# Patient Record
Sex: Female | Born: 2005 | Race: White | Hispanic: No | Marital: Single | Smoking: Never smoker
Health system: Southern US, Community
[De-identification: ages and names within clinical notes are randomized; demographics above are authoritative.]

---
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
## 2023-03-24 ENCOUNTER — Encounter: Payer: Self-pay | Admitting: Dermatology

## 2023-03-24 ENCOUNTER — Ambulatory Visit (INDEPENDENT_AMBULATORY_CARE_PROVIDER_SITE_OTHER): Payer: Medicaid Other | Admitting: Dermatology

## 2023-03-24 DIAGNOSIS — L700 Acne vulgaris: Secondary | ICD-10-CM | POA: Diagnosis not present

## 2023-03-24 MED ORDER — SPIRONOLACTONE 100 MG PO TABS: 100.0000 mg | ORAL_TABLET | Freq: Every day | ORAL | 2 refills | Status: DC

## 2023-03-24 MED ORDER — RETIN-A 0.05 % EX CREA: TOPICAL_CREAM | Freq: Every day | CUTANEOUS | 2 refills | Status: DC

## 2023-03-24 MED ORDER — CLINDAMYCIN PHOSPHATE 1 % EX SWAB: CUTANEOUS | 2 refills | Status: DC

## 2023-03-24 NOTE — Patient Instructions (Addendum)
Daily Regimen Template:  Morning: 1. Wash your face with a gentle cleanser (on weekends recommend Neutrogena Stubborn Texture) 2. Apply a pea-sized amount of  clindamycin to face 3. Follow with a moisturizer and sunscreen suitable for acne-prone skin.  Evening: 1. Wash your face with a gentle cleanser  2. Apply a pea-sized amount of  Retin-A 0.05% gel to entire face.  Start only using 2-3 night per week and gradually increase as tolerated. 3. Apply a non-comedogenic moisturizer to keep the skin hydrated overnight (Neutrogena, CeraVe, Cetaphil)  Note: Always follow your dermatologist's recommendations and treatment plan for best results. Consistency is key to managing acne effectively. If you experience any severe side effects or worsening of symptoms, consult your healthcare provider promptly.  Topical retinoid medications like tretinoin/Retin-A, adapalene/Differin, tazarotene/Fabior, and Epiduo/Epiduo Forte can cause dryness and irritation when first started. Only apply a pea-sized amount to the entire affected area. Avoid applying it around the eyes, edges of mouth and creases at the nose. If you experience irritation, use a good moisturizer first and/or apply the medicine less often. If you are doing well with the medicine, you can increase how often you use it until you are applying every night. Be careful with sun protection while using this medication as it can make you sensitive to the sun. This medicine should not be used by pregnant women.   Spironolactone can cause increased urination and cause blood pressure to decrease. Please watch for signs of lightheadedness and be cautious when changing position. It can sometimes cause breast tenderness or an irregular period in premenopausal women. It can also increase potassium. The increase in potassium usually is not a concern unless you are taking other medicines that also increase potassium, so please be sure your doctor knows all of the other  medications you are taking. This medication should not be taken by pregnant women.  This medicine should also not be taken together with sulfa drugs like Bactrim (trimethoprim/sulfamethexazole).   Due to recent changes in healthcare laws, you may see results of your pathology and/or laboratory studies on MyChart before the doctors have had a chance to review them. We understand that in some cases there may be results that are confusing or concerning to you. Please understand that not all results are received at the same time and often the doctors may need to interpret multiple results in order to provide you with the best plan of care or course of treatment. Therefore, we ask that you please give [place] 2 business days to thoroughly review all your results before contacting the office for clarification. Should we see a critical lab result, you will be contacted sooner.   If You Need Anything After Your Visit  If you have any questions or concerns for your doctor, please call our main line at [phone] If no one answers, please leave a voicemail as directed and we will return your call as soon as possible. Messages left after 4 pm will be answered the following business day.   You may also send [place] a message via MyChart. We typically respond to MyChart messages within 1-2 business days.  For prescription refills, please ask your pharmacy to contact our office. Our fax number is [phone].  If you have an urgent issue when the clinic is closed that cannot wait until the next business day, you can page your doctor at the number below.    Please note that while we do our best to be available for urgent issues outside  of office hours, we are not available 24/7.   If you have an urgent issue and are unable to reach [place], you may choose to seek medical care at your doctor's office, retail clinic, urgent care center, or emergency room.  If you have a medical emergency, please immediately call 911 or go to  the emergency department. In the event of inclement weather, please call our main line at [phone] for an update on the status of any delays or closures.  Dermatology Medication Tips: Please keep the boxes that topical medications come in in order to help keep track of the instructions about where and how to use these. Pharmacies typically print the medication instructions only on the boxes and not directly on the medication tubes.   If your medication is too expensive, please contact our office at [phone] or send [place] a message through MyChart.   We are unable to tell what your co-pay for medications will be in advance as this is different depending on your insurance coverage. However, we may be able to find a substitute medication at lower cost or fill out paperwork to get insurance to cover a needed medication.   If a prior authorization is required to get your medication covered by your insurance company, please allow [place] 1-2 business days to complete this process.  Drug prices often vary depending on where the prescription is filled and some pharmacies may offer cheaper prices.  The website www.goodrx.com contains coupons for medications through different pharmacies. The prices here do not account for what the cost may be with help from insurance (it may be cheaper with your insurance), but the website can give you the price if you did not use any insurance.  - You can print the associated coupon and take it with your prescription to the pharmacy.  - You may also stop by our office during regular business hours and pick up a GoodRx coupon card.  - If you need your prescription sent electronically to a different pharmacy, notify our office through [facility] or by phone at [phone]

## 2023-03-24 NOTE — Progress Notes (Signed)
   New Patient Visit   Subjective  Latoya Hayes is a 17 y.o. female who presents for the following: acne at face. Comes and goes, she does get cystic bumps. She has not done any prescription acne treatments but is using Kiehls moisturizer, Elta MD sunscreen and a goat's milk face wash. She sleeps with a bonnet on and changes her pillowcase daily. Patient has used OTC Differin which broke her out worse. Acne is worse with periods. She also has a rash at face that came up about 1 month ago and thinks it could be related to the acne.   Patient accompanied by grandmother who contributes to history.  The following portions of the chart were reviewed this encounter and updated as appropriate: medications, allergies, medical history  Review of Systems:  No other skin or systemic complaints except as noted in HPI or Assessment and Plan.  Objective  Well appearing patient in no apparent distress; mood and affect are within normal limits.   A focused examination was performed of the following areas: face  Relevant exam findings are noted in the Assessment and Plan.    Assessment & Plan   ACNE VULGARIS Exam: Open comedones and inflammatory papules on forehead, cheeks and nose  Chronic and persistent condition with duration or expected duration over one year. Condition is bothersome/symptomatic for patient. Currently flared.   Treatment Plan: Start clindamycin swabs daily in the morning after washing face.  Start Retin-A 0.05% gel (if not covered can switch to cream) nightly using a pea size amount to entire face on Monday, Wednesday and Fridays.  Start spironolactone   Recommend Neutrogena Stubborn Texture face wash on weekends only.   Topical retinoid medications like tretinoin/Retin-A, adapalene/Differin, tazarotene/Fabior, and Epiduo/Epiduo Forte can cause dryness and irritation when first started. Only apply a pea-sized amount to the entire affected area. Avoid applying it around  the eyes, edges of mouth and creases at the nose. If you experience irritation, use a good moisturizer first and/or apply the medicine less often. If you are doing well with the medicine, you can increase how often you use it until you are applying every night. Be careful with sun protection while using this medication as it can make you sensitive to the sun. This medicine should not be used by pregnant women.   Spironolactone can cause increased urination and cause blood pressure to decrease. Please watch for signs of lightheadedness and be cautious when changing position. It can sometimes cause breast tenderness or an irregular period in premenopausal women. It can also increase potassium. The increase in potassium usually is not a concern unless you are taking other medicines that also increase potassium, so please be sure your doctor knows all of the other medications you are taking. This medication should not be taken by pregnant women.  This medicine should also not be taken together with sulfa drugs like Bactrim (trimethoprim/sulfamethexazole).     Return in about 3 months (around 06/24/2023) for acne.  Anise Salvo, RMA, am acting as scribe for [employer], DO .   Documentation: I have reviewed the above documentation for accuracy and completeness, and I agree with the above.  Langston Reusing, DO

## 2023-05-25 ENCOUNTER — Ambulatory Visit (INDEPENDENT_AMBULATORY_CARE_PROVIDER_SITE_OTHER): Payer: Medicaid Other | Admitting: Advanced Practice Midwife

## 2023-05-25 ENCOUNTER — Other Ambulatory Visit (HOSPITAL_COMMUNITY)
Admission: RE | Admit: 2023-05-25 | Discharge: 2023-05-25 | Disposition: A | Discharge status: Home / Self Care | Payer: Medicaid Other | Source: Ambulatory Visit | Attending: Advanced Practice Midwife | Admitting: Advanced Practice Midwife

## 2023-05-25 VITALS — BP 119/83 | HR 88 | Ht 64.0 in | Wt 130.5 lb

## 2023-05-25 DIAGNOSIS — L739 Follicular disorder, unspecified: Secondary | ICD-10-CM | POA: Diagnosis not present

## 2023-05-25 DIAGNOSIS — Z113 Encounter for screening for infections with a predominantly sexual mode of transmission: Secondary | ICD-10-CM

## 2023-05-25 NOTE — Progress Notes (Signed)
Pt. Presents with blisters/bumps on vagina about a week ago. No itching, or burning. Lasted for 3 days but are gone now.  Pt. Did stat that she shaved and two days after is when she noticed the blisters/bumps

## 2023-05-25 NOTE — Progress Notes (Signed)
   GYNECOLOGY PROGRESS NOTE  History:  17 y.o. No obstetric history on file. presents to [facility] office today for problem gyn visit. She reports bumps on her labia and mons area that started 1 week ago and lasted 3 days .  She denies h/a, dizziness, shortness of breath, n/v, or fever/chills.    The following portions of the patient's history were reviewed and updated as appropriate: allergies, current medications, past family history, past medical history, past social history, past surgical history and problem list.   Health Maintenance Due  Topic Date Due   DTaP/Tdap/Td (1 - Tdap) Never done   CHLAMYDIA SCREENING  Never done   HPV VACCINES (1 - 3-dose series) Never done   HIV Screening  Never done   COVID-19 Vaccine (1 - 2023-24 season) Never done   Hepatitis C Screening  Never done   INFLUENZA VACCINE  05/07/2023     Review of Systems:  Pertinent items are noted in HPI.   Objective:  Physical Exam Blood pressure 119/83, pulse 88, height 5\' 4"  (1.626 m), weight 130 lb 8 oz (59.2 kg), last menstrual period 05/20/2023.  VS reviewed, nursing note reviewed,  Constitutional: well developed, well nourished, no distress HEENT: normocephalic CV: normal rate Pulm/chest wall: normal effort Breast Exam: deferred Abdomen: soft Neuro: alert and oriented x 3 Skin: warm, dry Psych: affect normal Pelvic exam: on visual inspection, no visible lesions, no abnormalities  Assessment & Plan:  1. Routine screening for STI (sexually transmitted infection)  - Cervicovaginal ancillary only( [place]) - HIV antibody (with reflex) - RPR - Hepatitis B Surface AntiGEN - Hepatitis C Antibody  2. Folliculitis --Discussed presentation and appearance of lesions with pt.   --Likely folliculitis --If bumps reoccur, pt to come to office for testing    No follow-ups on file.   Sharen Counter, CNM 3:51 PM

## 2023-05-26 LAB — HEPATITIS B SURFACE ANTIGEN: Hepatitis B Surface Ag: NEGATIVE

## 2023-05-26 LAB — RPR: RPR Ser Ql: NONREACTIVE

## 2023-05-26 LAB — HEPATITIS C ANTIBODY: Hep C Virus Ab: NONREACTIVE

## 2023-05-26 LAB — HIV ANTIBODY (ROUTINE TESTING W REFLEX): HIV Screen 4th Generation wRfx: NONREACTIVE

## 2023-05-27 LAB — CERVICOVAGINAL ANCILLARY ONLY
Chlamydia: NEGATIVE
Comment: NEGATIVE
Comment: NEGATIVE
Comment: NORMAL
Neisseria Gonorrhea: NEGATIVE
Trichomonas: NEGATIVE

## 2023-06-19 ENCOUNTER — Other Ambulatory Visit: Payer: Self-pay | Admitting: Dermatology

## 2023-06-24 ENCOUNTER — Ambulatory Visit: Payer: Medicaid Other | Admitting: Dermatology

## 2023-06-24 ENCOUNTER — Encounter: Payer: Self-pay | Admitting: Dermatology

## 2023-06-24 DIAGNOSIS — L700 Acne vulgaris: Secondary | ICD-10-CM

## 2023-06-24 MED ORDER — SPIRONOLACTONE 50 MG PO TABS: 150.0000 mg | ORAL_TABLET | Freq: Every evening | ORAL | 3 refills | Status: DC

## 2023-06-24 MED ORDER — RETIN-A 0.05 % EX CREA: TOPICAL_CREAM | Freq: Every day | CUTANEOUS | 4 refills | Status: DC

## 2023-06-24 NOTE — Patient Instructions (Signed)

## 2023-06-24 NOTE — Progress Notes (Signed)
   Follow-Up Visit   Subjective  Latoya Hayes is a 17 y.o. female who presents for the following: Acne  Patient present today for follow up visit for Acne. Patient was last evaluated on 03/24/23. Patient reports sxs are worse. Patient denies medication changes.  The following portions of the chart were reviewed this encounter and updated as appropriate: medications, allergies, medical history  Review of Systems:  No other skin or systemic complaints except as noted in HPI or Assessment and Plan.  Objective  Well appearing patient in no apparent distress; mood and affect are within normal limits.  A focused examination was performed of the following areas: Face  Relevant exam findings are noted in the Assessment and Plan.         Assessment & Plan   ACNE VULGARIS Exam: Open comedones and inflammatory papules  Flared  Treatment Plan: - We will plan to increase spironolactone to 150 mg nightly - We will increase Tretinoin 0.05% to 5 nights weekly - We will plan to follow up in 3 months   No follow-ups on file.   Documentation: I have reviewed the above documentation for accuracy and completeness, and I agree with the above.  Stasia Cavalier, am acting as scribe for Langston Reusing, DO.   Langston Reusing, DO

## 2023-06-28 ENCOUNTER — Other Ambulatory Visit: Payer: Self-pay | Admitting: Obstetrics and Gynecology

## 2023-07-16 ENCOUNTER — Other Ambulatory Visit: Payer: Self-pay

## 2023-07-16 MED ORDER — NORETHIN ACE-ETH ESTRAD-FE 1-20 MG-MCG(24) PO TABS: 1.0000 | ORAL_TABLET | Freq: Every day | ORAL | 2 refills | Status: DC

## 2023-08-31 ENCOUNTER — Other Ambulatory Visit: Payer: Self-pay | Admitting: Dermatology

## 2023-09-23 ENCOUNTER — Other Ambulatory Visit: Payer: Self-pay | Admitting: Dermatology

## 2023-09-23 DIAGNOSIS — L700 Acne vulgaris: Secondary | ICD-10-CM

## 2023-10-07 ENCOUNTER — Other Ambulatory Visit: Payer: Self-pay | Admitting: Dermatology

## 2023-10-07 DIAGNOSIS — L700 Acne vulgaris: Secondary | ICD-10-CM

## 2023-10-08 ENCOUNTER — Other Ambulatory Visit: Payer: Self-pay

## 2023-10-13 ENCOUNTER — Other Ambulatory Visit: Payer: Self-pay | Admitting: Dermatology

## 2023-10-16 ENCOUNTER — Other Ambulatory Visit: Payer: Self-pay | Admitting: *Deleted

## 2023-10-16 MED ORDER — NORETHIN ACE-ETH ESTRAD-FE 1-20 MG-MCG(24) PO TABS: 1.0000 | ORAL_TABLET | Freq: Every day | ORAL | 6 refills | Status: DC

## 2023-10-16 NOTE — Progress Notes (Signed)
 Refill on [facility] sent today.

## 2023-10-26 ENCOUNTER — Ambulatory Visit (INDEPENDENT_AMBULATORY_CARE_PROVIDER_SITE_OTHER): Payer: Medicaid Other | Admitting: Dermatology

## 2023-10-26 VITALS — BP 108/68 | HR 75

## 2023-10-26 DIAGNOSIS — L209 Atopic dermatitis, unspecified: Secondary | ICD-10-CM | POA: Diagnosis not present

## 2023-10-26 DIAGNOSIS — L700 Acne vulgaris: Secondary | ICD-10-CM | POA: Diagnosis not present

## 2023-10-26 MED ORDER — CLOBETASOL PROPIONATE 0.05 % EX CREA: 1.0000 | TOPICAL_CREAM | Freq: Two times a day (BID) | CUTANEOUS | 4 refills | Status: DC

## 2023-10-26 NOTE — Progress Notes (Signed)
   Follow-Up Visit   Subjective  Latoya Hayes is a 18 y.o. female who presents for the following: Acne  Patient present today for follow up visit for Acne. Patient was last evaluated on 06/24/23.Patient reports she has stopped the spironolactone because it was causing her to vomit. She is still using Trertinonin 0.5% 5 nights weekly. Patient reports sxs are  improving . Patient denies medication changes.  Patient also has atopic Dermatis on B/L LE. She is currently not any treatment for her legs.   The following portions of the chart were reviewed this encounter and updated as appropriate: medications, allergies, medical history  Review of Systems:  No other skin or systemic complaints except as noted in HPI or Assessment and Plan.  Objective  Well appearing patient in no apparent distress; mood and affect are within normal limits.  A focused examination was performed of the following areas: Face  Relevant exam findings are noted in the Assessment and Plan.          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 It is often associated with allergic rhinitis and/or asthma and can require treatment with topical medications, phototherapy, or in severe cases biologic injectable medication (Dupixent; Adbry) or Oral JAK inhibitors.  Treatment Plan: - We will prescribe Clobetasol Cream TO APPLY TO THE EFFECTED AREAS 2 TIMES A DAY FOR 2 DAYS THEN STOP - Recommend gentle skin care  ACNE VULGARIS Exam: Open comedones and inflammatory papules  Well Controlled  Treatment Plan: - Recommended continuing Tretinoin 0.5% M-F - Recommended continuing Clindamycin Swabs - Continue washing with Neutrogena Stubborn texture - We will plan to follow up in 6 months     No follow-ups on  file.    Documentation: I have reviewed the above documentation for accuracy and completeness, and I agree with the above.  Stasia Cavalier, am acting as scribe for Langston Reusing, DO.  Langston Reusing, DO

## 2023-10-26 NOTE — Patient Instructions (Addendum)
Hello Sidrah,  Thank you for visiting [place] today. I appreciate your dedication to improving your skin health. Below is a summary of our discussion and the instructions for your treatment plan:  Medications for Eczema:   Clobetasol Cream: Apply morning and night, twice a day for up to 2 weeks. Discontinue after this period or if the condition improves sooner.  Skincare Routine for Face:   Morning Routine: Continue using Neutrogena Stubborn Texture Face Wash followed by Clindamycin Pads and Kiehl's Moisturizer.   Evening Routine: Use Oat Milk Face Wash, Tretinoin 0.05% (5 nights a week), and [employer].   Weekend Routine: Limit to just washing and moisturizing.  Body Skincare:   Moisturizing: Use Eucerin Advanced Repair and Aquaphor Spray to lock in moisture.   Showering: Use warm water and a super moisturizer to prevent moisture loss.  Follow-Up:   Next Appointment: Schedule a follow-up appointment in July unless issues with eczema persist.  Your current facial skincare regimen is effective, and no changes are needed. Please follow the new instructions for managing your eczema and continue with your successful facial care routine. If you have any questions or concerns before our next scheduled visit, feel free to contact the office.  Best regards,  Dr. Langston Reusing Dermatology   Important Information   Due to recent changes in healthcare laws, you may see results of your pathology and/or laboratory studies on MyChart before the doctors have had a chance to review them. We understand that in some cases there may be results that are confusing or concerning to you. Please understand that not all results are received at the same time and often the doctors may need to interpret multiple results in order to provide you with the best plan of care or course of treatment. Therefore, we ask that you please give [place] 2 business days to thoroughly review all your results before contacting  the office for clarification. Should we see a critical lab result, you will be contacted sooner.     If You Need Anything After Your Visit   If you have any questions or concerns for your doctor, please call our main line at [phone]. If no one answers, please leave a voicemail as directed and we will return your call as soon as possible. Messages left after 4 pm will be answered the following business day.    You may also send [place] a message via MyChart. We typically respond to MyChart messages within 1-2 business days.  For prescription refills, please ask your pharmacy to contact our office. Our fax number is [phone].  If you have an urgent issue when the clinic is closed that cannot wait until the next business day, you can page your doctor at the number below.     Please note that while we do our best to be available for urgent issues outside of office hours, we are not available 24/7.    If you have an urgent issue and are unable to reach [place], you may choose to seek medical care at your doctor's office, retail clinic, urgent care center, or emergency room.   If you have a medical emergency, please immediately call 911 or go to the emergency department. In the event of inclement weather, please call our main line at [phone] for an update on the status of any delays or closures.  Dermatology Medication Tips: Please keep the boxes that topical medications come in in order to help keep track of the instructions about where and  how to use these. Pharmacies typically print the medication instructions only on the boxes and not directly on the medication tubes.   If your medication is too expensive, please contact our office at [phone] or send [place] a message through MyChart.    We are unable to tell what your co-pay for medications will be in advance as this is different depending on your insurance coverage. However, we may be able to find a substitute medication at lower cost or  fill out paperwork to get insurance to cover a needed medication.    If a prior authorization is required to get your medication covered by your insurance company, please allow [place] 1-2 business days to complete this process.   Drug prices often vary depending on where the prescription is filled and some pharmacies may offer cheaper prices.   The website www.goodrx.com contains coupons for medications through different pharmacies. The prices here do not account for what the cost may be with help from insurance (it may be cheaper with your insurance), but the website can give you the price if you did not use any insurance.  - You can print the associated coupon and take it with your prescription to the pharmacy.  - You may also stop by our office during regular business hours and pick up a GoodRx coupon card.  - If you need your prescription sent electronically to a different pharmacy, notify our office through [facility] or by phone at [phone]

## 2023-11-01 ENCOUNTER — Encounter: Payer: Self-pay | Admitting: Dermatology

## 2024-02-02 ENCOUNTER — Other Ambulatory Visit: Payer: Self-pay | Admitting: Dermatology

## 2024-03-27 ENCOUNTER — Other Ambulatory Visit: Payer: Self-pay | Admitting: Obstetrics and Gynecology

## 2024-03-28 ENCOUNTER — Other Ambulatory Visit: Payer: Self-pay

## 2024-03-28 MED ORDER — NORETHIN ACE-ETH ESTRAD-FE 1-20 MG-MCG(24) PO TABS: 1.0000 | ORAL_TABLET | Freq: Every day | ORAL | 2 refills | Status: DC

## 2024-04-26 ENCOUNTER — Ambulatory Visit (INDEPENDENT_AMBULATORY_CARE_PROVIDER_SITE_OTHER): Payer: Medicaid Other | Admitting: Dermatology

## 2024-04-26 ENCOUNTER — Encounter: Payer: Self-pay | Admitting: Dermatology

## 2024-04-26 VITALS — BP 65/62

## 2024-04-26 DIAGNOSIS — L700 Acne vulgaris: Secondary | ICD-10-CM | POA: Diagnosis not present

## 2024-04-26 DIAGNOSIS — L299 Pruritus, unspecified: Secondary | ICD-10-CM | POA: Diagnosis not present

## 2024-04-26 DIAGNOSIS — L209 Atopic dermatitis, unspecified: Secondary | ICD-10-CM

## 2024-04-26 MED ORDER — CLOBETASOL PROPIONATE 0.05 % EX CREA: 1.0000 | TOPICAL_CREAM | Freq: Two times a day (BID) | CUTANEOUS | 4 refills | Status: DC

## 2024-04-26 MED ORDER — CLINDAMYCIN PHOSPHATE 1 % EX SWAB: 1.0000 "application " | Freq: Every morning | CUTANEOUS | 3 refills | Status: DC

## 2024-04-26 NOTE — Patient Instructions (Addendum)
 Date: Tue Apr 26 2024  Hello [place],  Thank you for visiting today. Here is a summary of the key instructions:  - Medications:   - Continue using clindamycin  pads every morning   - Use tretinoin  0.05% three nights a week   - Reduce tretinoin  to two nights a week during winter months if needed  - Skin Care:   - Rinse off chlorine immediately after swimming   - Use Eucerin moisturizer daily   - Try Coppertone Pure and Simple zinc oxide spray sunscreen   - Avoid Blue Lizard sunscreen if it causes itching  - Follow-up:   - Schedule a follow-up appointment in one year (June or July 2026)   - Use MyChart to send messages or schedule earlier appointments if needed  - Prescriptions:   - Three-month supply of clindamycin  pads with three refills   - Call if you run out of tretinoin  before your next appointment  Please reach out if you have any questions or concerns.  Warm regards,  Dr. Delon Lenis, Dermatology       Important Information  Due to recent changes in healthcare laws, you may see results of your pathology and/or laboratory studies on MyChart before the doctors have had a chance to review them. We understand that in some cases there may be results that are confusing or concerning to you. Please understand that not all results are received at the same time and often the doctors may need to interpret multiple results in order to provide you with the best plan of care or course of treatment. Therefore, we ask that you please give us  2 business days to thoroughly review all your results before contacting the office for clarification. Should we see a critical lab result, you will be contacted sooner.   If You Need Anything After Your Visit  If you have any questions or concerns for your doctor, please call our main line at [phone] If no one answers, please leave a voicemail as directed and we will return your call as soon as possible. Messages left after 4 pm will be  answered the following business day.   You may also send us  a message via MyChart. We typically respond to MyChart messages within 1-2 business days.  For prescription refills, please ask your pharmacy to contact our office. Our fax number is [phone].  If you have an urgent issue when the clinic is closed that cannot wait until the next business day, you can page your doctor at the number below.    Please note that while we do our best to be available for urgent issues outside of office hours, we are not available 24/7.   If you have an urgent issue and are unable to reach us , you may choose to seek medical care at your doctor's office, retail clinic, urgent care center, or emergency room.  If you have a medical emergency, please immediately call 911 or go to the emergency department. In the event of inclement weather, please call our main line at [phone] for an update on the status of any delays or closures.  Dermatology Medication Tips: Please keep the boxes that topical medications come in in order to help keep track of the instructions about where and how to use these. Pharmacies typically print the medication instructions only on the boxes and not directly on the medication tubes.   If your medication is too expensive, please contact our office at [phone] or send us  a message through  MyChart.   We are unable to tell what your co-pay for medications will be in advance as this is different depending on your insurance coverage. However, we may be able to find a substitute medication at lower cost or fill out paperwork to get insurance to cover a needed medication.   If a prior authorization is required to get your medication covered by your insurance company, please allow us  1-2 business days to complete this process.  Drug prices often vary depending on where the prescription is filled and some pharmacies may offer cheaper prices.  The website www.goodrx.com contains  coupons for medications through different pharmacies. The prices here do not account for what the cost may be with help from insurance (it may be cheaper with your insurance), but the website can give you the price if you did not use any insurance.  - You can print the associated coupon and take it with your prescription to the pharmacy.  - You may also stop by our office during regular business hours and pick up a GoodRx coupon card.  - If you need your prescription sent electronically to a different pharmacy, notify our office through [facility] or by phone at [phone]

## 2024-04-26 NOTE — Progress Notes (Signed)
   Follow-Up Visit   Subjective  Latoya Hayes is a 18 y.o. female who presents for the following: Acne Vulgaris follow up. She is using clindamycin  swabs every morning and tretinoin  0.05% cream 3 nights per week. She has flares around her periods but is good the rest of the month. She is also following up for atopic dermatitis of her legs. She is using clobetasol  cream about 3 times per week. It seems to be worse when she is in the pool more or she thinks it may be related to the sunscreen she is using.     The following portions of the chart were reviewed this encounter and updated as appropriate: medications, allergies, medical history  Review of Systems:  No other skin or systemic complaints except as noted in HPI or Assessment and Plan.  Objective  Well appearing patient in no apparent distress; mood and affect are within normal limits.  Areas Examined: Face, legs  Relevant exam findings are noted in the Assessment and Plan.           Assessment & Plan   1. Acne - Assessment: Patient's acne has shown significant improvement with current treatment regimen. Combination of clindamycin  pads and tretinoin  working effectively. Patient tolerates tretinoin  0.05% three nights per week, which appears optimal. No oral medications currently needed for acne management.  - Plan:    Continue clindamycin  pads every morning    Continue tretinoin  0.05% three nights per week    Prescribe 6-month supply of clindamycin  pads with 3 refills    Advised to reduce tretinoin  use to two nights per week during winter months if needed    Follow up in one year    Contact office if tretinoin  refill needed before next appointment  2. Pruritus - Assessment: Patient reports persistent itching on legs, particularly after pool exposure. Itching occurs without visible rashes, suggesting possible chlorine sensitivity or inadequate post-swim skin care. Current sunscreen [facility]) may be contributing to  skin irritation. Patient's brother also experiences similar reactions to the sunscreen.  - Plan:    Recommend switching to a physical, mineral sunscreen (e.g., Coppertone Pure and Simple with zinc oxide)    Advise immediate rinsing after swimming to remove chlorine    Continue daily use of Excedrin as a moisturizer    Reassess at next follow-up appointment in approximately one year (June or July) ATOPIC DERMATITIS, UNSPECIFIED TYPE   Related Medications clobetasol  cream (TEMOVATE ) 0.05 % Apply 1 Application topically 2 (two) times daily. APPLY 2 TIMES DAILY FOR 2 WEEKS THEN STOP ACNE VULGARIS Exam: Clear today   Treatment Plan: Continue clindamycin  swabs every morning, tretinoin  0.05% cream at bedtime 3 nights per week  ATOPIC DERMATITIS Exam: Legs are clear today   Treatment Plan: Recommend Claritin or Allegra daily   Continue clobetasol  cream twice daily x 2 weeks as needed.   Recommend Aquaphor spray before getting in the pool.  Recommend gentle skin care. derma   Return in about 1 year (around 04/26/2025) for Acne.  I, Roseline Hutchinson, CMA, am acting as scribe for [employer], DO .   Documentation: I have reviewed the above documentation for accuracy and completeness, and I agree with the above.  Delon Lenis, DO

## 2024-06-17 ENCOUNTER — Other Ambulatory Visit: Payer: Self-pay

## 2024-06-17 MED ORDER — NORETHIN ACE-ETH ESTRAD-FE 1-20 MG-MCG(24) PO TABS: 1.0000 | ORAL_TABLET | Freq: Every day | ORAL | 0 refills | Status: DC

## 2024-08-15 ENCOUNTER — Other Ambulatory Visit: Payer: Self-pay | Admitting: Obstetrics and Gynecology

## 2024-09-07 ENCOUNTER — Other Ambulatory Visit: Payer: Self-pay

## 2024-09-07 ENCOUNTER — Telehealth: Payer: Self-pay | Admitting: Dermatology

## 2024-09-07 MED ORDER — NORETHIN ACE-ETH ESTRAD-FE 1-20 MG-MCG(24) PO TABS: 1.0000 | ORAL_TABLET | Freq: Every day | ORAL | 0 refills | Status: AC

## 2024-09-07 NOTE — Telephone Encounter (Signed)
 Pt states she needs a refill on her birthcontrol medication that she states Dr. Alm prescribed to her. Pt was unable to provide exact medication name. She states she was placed on it for her acne. She states she has called several times to have it refilled and her pharmacy has called, but she has not heard back, she would like a call back to discuss further. Call back number: [phone].

## 2024-09-07 NOTE — Progress Notes (Signed)
 BC refill sent per protocol Next appt 09/19/24

## 2024-09-08 NOTE — Telephone Encounter (Signed)
 I never rx'd pt her oral BC but we did rx her spironolactone .  Plesae call and if that's what she needs then we can refill that.  Per her chart it looks like another doc refilled her oral BC yesterday so it's probable the spironolactone .  Thanks.  Dr JONETTA

## 2024-09-19 ENCOUNTER — Encounter: Payer: Self-pay | Admitting: Obstetrics

## 2024-09-19 ENCOUNTER — Ambulatory Visit: Admitting: Obstetrics

## 2024-09-19 VITALS — BP 125/86 | HR 83 | Ht 64.0 in | Wt 135.2 lb

## 2024-09-19 DIAGNOSIS — N946 Dysmenorrhea, unspecified: Secondary | ICD-10-CM

## 2024-09-19 DIAGNOSIS — Z01419 Encounter for gynecological examination (general) (routine) without abnormal findings: Secondary | ICD-10-CM | POA: Diagnosis not present

## 2024-09-19 DIAGNOSIS — Z3041 Encounter for surveillance of contraceptive pills: Secondary | ICD-10-CM

## 2024-09-19 MED ORDER — IBUPROFEN 800 MG PO TABS: 800.0000 mg | ORAL_TABLET | Freq: Three times a day (TID) | ORAL | 5 refills | Status: DC | PRN

## 2024-09-19 MED ORDER — NORETHIN ACE-ETH ESTRAD-FE 1-20 MG-MCG(24) PO TABS: 1.0000 | ORAL_TABLET | Freq: Every day | ORAL | 11 refills | Status: DC

## 2024-09-19 NOTE — Progress Notes (Addendum)
 Subjective:        Latoya Hayes is a 18 y.o. female here for a routine exam.  Current complaints: None.    Personal health questionnaire:  Is patient Latoya Hayes, have a family history of breast and/or ovarian cancer: no Is there a family history of uterine cancer diagnosed at age < 80, gastrointestinal cancer, urinary tract cancer, family member who is a Personnel Officer syndrome-associated carrier: no Is the patient overweight and hypertensive, family history of diabetes, personal history of gestational diabetes, preeclampsia or PCOS: no Is patient over 51, have PCOS,  family history of premature CHD under age 48, diabetes, smoke, have hypertension or peripheral artery disease:  no At any time, has a partner hit, kicked or otherwise hurt or frightened you?: no Over the past 2 weeks, have you felt down, depressed or hopeless?: no Over the past 2 weeks, have you felt little interest or pleasure in doing things?:no   Gynecologic History Patient's last menstrual period was 08/26/2024 (exact date). Contraception: OCP (estrogen/progesterone) Last Pap: n/a. Results were: n/a Last mammogram: n/a. Results were: n/a  Obstetric History OB History  Gravida Para Term Preterm AB Living  0 0 0 0 0 0  SAB IAB Ectopic Multiple Live Births  0 0 0 0 0    History reviewed. No pertinent past medical history.  History reviewed. No pertinent surgical history.  Current Medications[1] Allergies[2]  Social History   Tobacco Use   Smoking status: Never   Smokeless tobacco: Never  Substance Use Topics   Alcohol use: Never    Family History  Problem Relation Age of Onset   Healthy Father    Healthy Mother       Review of Systems  Constitutional: negative for fatigue and weight loss Respiratory: negative for cough and wheezing Cardiovascular: negative for chest pain, fatigue and palpitations Gastrointestinal: negative for abdominal pain and change in bowel habits Musculoskeletal:negative  for myalgias Neurological: negative for gait problems and tremors Behavioral/Psych: negative for abusive relationship, depression Endocrine: negative for temperature intolerance    Genitourinary:negative for abnormal menstrual periods, genital lesions, hot flashes, sexual problems and vaginal discharge Integument/breast: negative for breast lump, breast tenderness, nipple discharge and skin lesion(s)    Objective:       BP 125/86   Pulse 83   Ht 5' 4 (1.626 m)   Wt 135 lb 3.2 oz (61.3 kg)   LMP 08/26/2024 (Exact Date)   BMI 23.21 kg/m  General:   Alert and no distress  Skin:   no rash or abnormalities  Lungs:   clear to auscultation bilaterally  Heart:   regular rate and rhythm, S1, S2 normal, no murmur, click, rub or gallop  Breasts:   normal without suspicious masses, skin or nipple changes or axillary nodes  Abdomen:  normal findings: no organomegaly, soft, non-tender and no hernia  Pelvis:  External genitalia: normal general appearance Urinary system: urethral meatus normal and bladder without fullness, nontender Vaginal: normal without tenderness, induration or masses Cervix: normal appearance Adnexa: normal bimanual exam Uterus: anteverted and non-tender, normal size   Lab Review Urine pregnancy test Labs reviewed yes Radiologic studies reviewed no  I have spent a total of 20 minutes of face-to-face time, excluding clinical staff time, reviewing notes and preparing to see patient, ordering tests and/or medications, and counseling the patient.   Assessment:    1. Encounter for annual routine gynecological examination (Primary)  2. Encounter for surveillance of contraceptive pills Rx: - Norethindrone Acetate-Ethinyl Estrad-FE (LOESTRIN  24 FE) 1-20 MG-MCG(24) tablet; Take 1 tablet by mouth daily.  Dispense: 28 tablet; Refill: 11  3. Dysmenorrhea Rx: - ibuprofen  (ADVIL ) 800 MG tablet; Take 1 tablet (800 mg total) by mouth every 8 (eight) hours as needed.  Dispense:  30 tablet; Refill: 5     Plan:    Education reviewed: calcium supplements, depression evaluation, low fat, low cholesterol diet, safe sex/STD prevention, self breast exams, skin cancer screening, and weight bearing exercise. Contraception: OCP (estrogen/progesterone). Follow up in: 1 year.   Meds ordered this encounter  Medications   Norethindrone Acetate-Ethinyl Estrad-FE (LOESTRIN 24 FE) 1-20 MG-MCG(24) tablet    Sig: Take 1 tablet by mouth daily.    Dispense:  28 tablet    Refill:  11     CARLIN RONAL CENTERS, MD, FACOG Attending Obstetrician & Gynecologist, [facility] for [facility], [facility], [facility] 09/19/2024     [1]  Current Outpatient Medications:    clindamycin  (CLEOCIN  T) 1 % SWAB, Apply 1 application  topically every morning. Apply to face daily every morning, Disp: 90 each, Rfl: 3   Norethindrone Acetate-Ethinyl Estrad-FE (LOESTRIN 24 FE) 1-20 MG-MCG(24) tablet, Take 1 tablet by mouth daily., Disp: 28 tablet, Rfl: 0   Norethindrone Acetate-Ethinyl Estrad-FE (LOESTRIN 24 FE) 1-20 MG-MCG(24) tablet, Take 1 tablet by mouth daily., Disp: 28 tablet, Rfl: 11   RETIN-A  0.05 % cream, Apply topically at bedtime., Disp: 45 g, Rfl: 4   cetirizine (ZYRTEC) 10 MG tablet, Take 10 mg by mouth daily. (Patient not taking: Reported on 09/19/2024), Disp: , Rfl:    clindamycin  (CLEOCIN  T) 1 % SWAB, APPLY DAILY IN THE MORNING. (Patient not taking: Reported on 09/19/2024), Disp: 60 each, Rfl: 2   clobetasol  cream (TEMOVATE ) 0.05 %, Apply 1 Application topically 2 (two) times daily. APPLY 2 TIMES DAILY FOR 2 WEEKS THEN STOP (Patient not taking: Reported on 09/19/2024), Disp: 60 g, Rfl: 4   fluorouracil  (EFUDEX ) 5 % cream, Apply topically 2 (two) times daily. (Patient not taking: Reported on 09/19/2024), Disp: 40 g, Rfl: 0   norethindrone-ethinyl estradiol (CYCLAFEM) 0.5/0.75/1-35 MG-MCG tablet, Take 1 tablet by mouth daily. (Patient not taking: Reported on  09/19/2024), Disp: , Rfl:    spironolactone  (ALDACTONE ) 50 MG tablet, TAKE 3 TABLETS BY MOUTH AT BEDTIME (Patient not taking: Reported on 09/19/2024), Disp: 270 tablet, Rfl: 3 [2]  Allergies Allergen Reactions   Peanut-Containing Drug Products Rash    Cashew

## 2024-09-19 NOTE — Progress Notes (Signed)
 Annual; Birth control refills.   Declines STD/STI testing.

## 2024-09-27 ENCOUNTER — Ambulatory Visit: Admitting: Podiatry

## 2024-10-11 ENCOUNTER — Encounter: Payer: Self-pay | Admitting: Podiatry

## 2024-10-11 ENCOUNTER — Ambulatory Visit: Admitting: Podiatry

## 2024-10-11 DIAGNOSIS — Z91010 Allergy to peanuts: Secondary | ICD-10-CM | POA: Insufficient documentation

## 2024-10-11 DIAGNOSIS — J301 Allergic rhinitis due to pollen: Secondary | ICD-10-CM | POA: Insufficient documentation

## 2024-10-11 DIAGNOSIS — L600 Ingrowing nail: Secondary | ICD-10-CM

## 2024-10-11 DIAGNOSIS — H1045 Other chronic allergic conjunctivitis: Secondary | ICD-10-CM | POA: Insufficient documentation

## 2024-10-11 DIAGNOSIS — J3081 Allergic rhinitis due to animal (cat) (dog) hair and dander: Secondary | ICD-10-CM | POA: Insufficient documentation

## 2024-10-11 MED ORDER — CEPHALEXIN 500 MG PO CAPS: 500.0000 mg | ORAL_CAPSULE | Freq: Three times a day (TID) | ORAL | 0 refills | Status: AC

## 2024-10-11 MED ORDER — NEOMYCIN-POLYMYXIN-HC 1 % OT SOLN: OTIC | 1 refills | Status: AC

## 2024-10-11 MED ORDER — NEOMYCIN-POLYMYXIN-HC 3.5-10000-1 OT SOLN: OTIC | 0 refills | Status: DC

## 2024-10-11 NOTE — Progress Notes (Signed)
"  °  Subjective:  Patient ID: Latoya Hayes, female    DOB: 25-Jul-2006,  MRN: [id number] HPI Chief Complaint  Patient presents with   Toe Pain    Hallux left - lateral border, tender, draining x few weeks   New Patient (Initial Visit)    Est pt 2019    19 y.o. female presents with the above complaint.  That she presents today without her grandmother she is concerned about getting injections to her toes since she has had similar procedures before and she is not sure that she wants to have it done today without someone with her.  ROS: Denies fever chills nausea vomiting muscle aches pains calf pain back pain chest pain shortness of breath.  No past medical history on file. No past surgical history on file. Current Medications[1]  Allergies[2] Review of Systems Objective:  There were no vitals filed for this visit.  General: Well developed, nourished, in no acute distress, alert and oriented x3   Dermatological: Skin is warm, dry and supple bilateral. Nails x 10 are well maintained; remaining integument appears unremarkable at this time. There are no open sores, no preulcerative lesions, no rash or signs of infection present.  Sharp incurvated nail margins along the fibular border of the hallux bilateral.  There is mild erythema no purulence no malodor.  Vascular: Dorsalis Pedis artery and Posterior Tibial artery pedal pulses are 2/4 bilateral with immedate capillary fill time. Pedal hair growth present. No varicosities and no lower extremity edema present bilateral.   Neruologic: Grossly intact via light touch bilateral. Vibratory intact via tuning fork bilateral. Protective threshold with Semmes Wienstein monofilament intact to all pedal sites bilateral. Patellar and Achilles deep tendon reflexes 2+ bilateral. No Babinski or clonus noted bilateral.   Musculoskeletal: No gross boney pedal deformities bilateral. No pain, crepitus, or limitation noted with foot and ankle range of motion  bilateral. Muscular strength 5/5 in all groups tested bilateral.  Gait: Unassisted, Nonantalgic.    Radiographs:  None taken  Assessment & Plan:   Assessment: Ingrown toenails paronychia hallux bilateral  Plan: Started her on Keflex  500 twice daily mg she will start soaking daily and apply Cortisporin Otic after soaking.  Follow-up with her in 1 week to have matricectomy's performed to the lateral borders of the hallux bilateral     Othelia Riederer T. Tayvion Lauder, DPM    [1]  Current Outpatient Medications:    cephALEXin  (KEFLEX ) 500 MG capsule, Take 1 capsule (500 mg total) by mouth 3 (three) times daily., Disp: 30 capsule, Rfl: 0   NEOMYCIN -POLYMYXIN-HYDROCORTISONE (CORTISPORIN) 1 % SOLN OTIC solution, Apply 1-2 drops to toe BID after soaking, Disp: 10 mL, Rfl: 1   norethindrone-ethinyl estradiol-FE (HAILEY FE 1/20) 1-20 MG-MCG tablet, Take 1 tablet by mouth daily., Disp: , Rfl:  [2]  Allergies Allergen Reactions   Peanut-Containing Drug Products Rash    Cashew   "

## 2024-10-11 NOTE — Patient Instructions (Addendum)
 SABRA

## 2024-10-18 ENCOUNTER — Ambulatory Visit (INDEPENDENT_AMBULATORY_CARE_PROVIDER_SITE_OTHER): Admitting: Podiatry

## 2024-10-18 DIAGNOSIS — L600 Ingrowing nail: Secondary | ICD-10-CM | POA: Diagnosis not present

## 2024-10-18 NOTE — Progress Notes (Signed)
 She presents today with her grandmother for a follow-up of her painful ingrown toenails last time she was in she did not want the toenails done because she did not have anybody to help support.  Objective: Vital signs are stable alert oriented x 3.  Pulses are palpable.  There is no erythema edema cellulitis drainage or odor.  Sharp incurvated nail margins exquisitely painful palpation no signs of infection currently.  Assessment: Painful ingrown toenails to inferior border of the hallux bilateral.  Plan: Discussed etiology pathology and surgical therapies at this point chemical matricectomy was performed to the tibial-fibular border the hallux bilateral after local anesthetic was administered.  Each border was split and distal proximal avulsed exposing the matrix.  Once the matrix was exposed 3 epithets of phenol were applied 30 seconds each to each border site.  It was then neutralized with isopropyl alcohol Silvadene cream Telfa pad and dressed a compressive dressing was applied.  She was also provided with another prescription for Cortisporin Otic to be applied twice daily after soaking.  Will follow-up with her in the near future.

## 2024-10-18 NOTE — Patient Instructions (Signed)

## 2024-11-08 ENCOUNTER — Ambulatory Visit: Admitting: Podiatry

## 2024-11-15 ENCOUNTER — Ambulatory Visit: Admitting: Podiatry

## 2025-04-27 ENCOUNTER — Ambulatory Visit: Admitting: Dermatology
